# Patient Record
Sex: Male | Born: 2005 | Race: Black or African American | Hispanic: No | Marital: Single | Smoking: Never smoker
Health system: Southern US, Community
[De-identification: ages and names within clinical notes are randomized; demographics above are authoritative.]

## PROBLEM LIST (undated history)

## (undated) DIAGNOSIS — F449 Dissociative and conversion disorder, unspecified: Secondary | ICD-10-CM

## (undated) DIAGNOSIS — F4310 Post-traumatic stress disorder, unspecified: Secondary | ICD-10-CM

---
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
## 2024-03-16 ENCOUNTER — Other Ambulatory Visit: Payer: Self-pay

## 2024-03-16 ENCOUNTER — Encounter (HOSPITAL_COMMUNITY): Payer: Self-pay | Admitting: Emergency Medicine

## 2024-03-16 ENCOUNTER — Emergency Department (HOSPITAL_COMMUNITY)
Admission: EM | Admit: 2024-03-16 | Discharge: 2024-03-16 | Disposition: A | Discharge status: Home / Self Care | Payer: Self-pay | Attending: Emergency Medicine | Admitting: Emergency Medicine

## 2024-03-16 DIAGNOSIS — K0889 Other specified disorders of teeth and supporting structures: Secondary | ICD-10-CM

## 2024-03-16 DIAGNOSIS — K029 Dental caries, unspecified: Secondary | ICD-10-CM | POA: Insufficient documentation

## 2024-03-16 MED ORDER — AMOXICILLIN-POT CLAVULANATE 875-125 MG PO TABS: 1.0000 | ORAL_TABLET | Freq: Two times a day (BID) | ORAL | 0 refills | Status: AC

## 2024-03-16 MED ORDER — KETOROLAC TROMETHAMINE 10 MG PO TABS: 10.0000 mg | ORAL_TABLET | Freq: Four times a day (QID) | ORAL | 0 refills | Status: AC | PRN

## 2024-03-16 MED ORDER — KETOROLAC TROMETHAMINE 15 MG/ML IJ SOLN
15.0000 mg | Freq: Once | INTRAMUSCULAR | Status: AC
  Administered 2024-03-16: 15 mg via INTRAMUSCULAR
  Filled 2024-03-16: qty 1

## 2024-03-16 NOTE — ED Provider Notes (Signed)
 [place] EMERGENCY DEPARTMENT AT [facility] Provider Note   CSN: [id number] Arrival date & time: 03/16/24  2000     Patient presents with: Dental Pain   Gregg Pham is a 18 y.o. male.  Patient is a 18 year old male with no significant medical history who presents to the ED for increasing left lower dental pain for the past 2 days.  States he has a bad wisdom tooth on this side.  He was scheduled to get the tooth taken out in January 2025 but the procedure was canceled.  He notes he has had some swelling in the mouth as well.  He has been taking Tylenol  and ibuprofen  with minimal relief.  Denies any fevers or difficulty breathing.  No further complaints.    Dental Pain Associated symptoms: no fever        Prior to Admission medications   Medication Sig Start Date End Date Taking? Authorizing Provider  amoxicillin -clavulanate (AUGMENTIN ) 875-125 MG tablet Take 1 tablet by mouth every 12 (twelve) hours for 7 days. 03/16/24 03/23/24 Yes Marykatherine Sherwood, Thersia RAMAN, PA-C  ketorolac  (TORADOL ) 10 MG tablet Take 1 tablet (10 mg total) by mouth every 6 (six) hours as needed. 03/16/24  Yes Neysa Thersia RAMAN, PA-C  GuanFACINE  HCl 3 MG TB24 Take 3 mg by mouth at bedtime. 01/16/20   [provider]  hydrOXYzine  (ATARAX /VISTARIL ) 25 MG tablet Take 1 tablet (25 mg total) by mouth at bedtime. 07/04/19   Jonnalagadda, Janardhana, MD  lisdexamfetamine (VYVANSE ) 30 MG capsule Take 1 capsule (30 mg total) by mouth every morning. 07/05/19   Jonnalagadda, Janardhana, MD  sertraline  (ZOLOFT ) 50 MG tablet Take 50 mg by mouth daily. 01/06/20   [provider]    Allergies: Patient has no known allergies.    Review of Systems  Constitutional:  Negative for fever.  HENT:  Positive for dental problem.   Respiratory:  Negative for shortness of breath.   Cardiovascular:  Negative for chest pain.  All other systems reviewed and are negative.   Updated Vital Signs BP 106/84   Pulse 87    Temp 97.8 F (36.6 C) (Oral)   Resp 18   Ht 5' 10 (1.778 m)   Wt 72.6 kg   SpO2 98%   BMI 22.96 kg/m   Physical Exam Constitutional:      Appearance: Normal appearance.  HENT:     Head: Normocephalic and atraumatic.     Mouth/Throat:     Mouth: Mucous membranes are moist.     Pharynx: Oropharynx is clear.     Comments: Bottom third left molar appears to have a caries.  Surrounding gingival edema.  No signs of abscess.  No overlying buccal or facial edema.  No trismus or submandibular edema.  Posterior oropharynx clear Neurological:     Mental Status: He is alert and oriented to person, place, and time.  Psychiatric:        Mood and Affect: Mood normal.        Behavior: Behavior normal.     (all labs ordered are listed, but only abnormal results are displayed) Labs Reviewed - No data to display  EKG: None  Radiology: No results found.    Medications Ordered in the ED  ketorolac  (TORADOL ) 15 MG/ML injection 15 mg (has no administration in time range)  Medical Decision Making Patient is a 18 year old male who presents to the ED for left lower dental pain for the past 2 days.  Please see detailed HPI above.  On exam patient is alert and well-appearing.  Physical exam as noted above.  He does appear to have a caries around the left bottom wisdom tooth but no signs of obvious abscess.  No submandibular edema or trismus to suggest deep neck infection.  Suspect patient's symptoms secondary to dental infection.  Will be treated empirically.  Otherwise well-appearing and afebrile.  Stable for discharge home.  Patient given Toradol  in ED for pain.  Prescribed Augmentin  and Toradol .  Symptomatic care discussed.  Resources for dental follow-up provided.  Return precautions provided for worsening symptoms.  Risk Prescription drug management.       Final diagnoses:  Pain, dental    ED Discharge Orders          Ordered     amoxicillin -clavulanate (AUGMENTIN ) 875-125 MG tablet  Every 12 hours        03/16/24 2050    ketorolac  (TORADOL ) 10 MG tablet  Every 6 hours PRN        03/16/24 2050               Neysa Thersia RAMAN, PA-C 03/16/24 2053    Melvenia Motto, MD 03/17/24 [phone]

## 2024-03-16 NOTE — ED Triage Notes (Signed)
 Pt complains of left sound mouth pain started yesterday. Was supposed to have wisdom teeth taken out at the beginning of the year and it was cancelled. The pain went away and pt concerned that the pain now is the wisdom teeth on left side. No facial swelling or fever.

## 2024-03-16 NOTE — ED Notes (Signed)
 Pt/family received d/c paperwork at this time. After going over the paperwork any questions, comments, or concerns were answered to the best of this nurse's knowledge. The pt/family verbally acknowledged the teachings/instructions.

## 2024-03-16 NOTE — Discharge Instructions (Signed)
 Begin taking Augmentin  as prescribed.  May take Toradol  every 6 hours as needed for pain.  Do not take extra ibuprofen  at the same time.  May gargle with warm salt water to help keep mouth clean as well.  Please follow-up with dentist for further evaluation management of continued pain around the wisdom teeth.  Return to ED if any symptoms worsen including severe control pain, severe swelling to the mouth, new fevers, difficulty breathing.
